# Patient Record
Sex: Male | Born: 1995 | Race: Black or African American | Hispanic: No | Marital: Single | Smoking: Never smoker
Health system: Southern US, Community
[De-identification: ages and names within clinical notes are randomized; demographics above are authoritative.]

## PROBLEM LIST (undated history)

## (undated) DIAGNOSIS — J45909 Unspecified asthma, uncomplicated: Secondary | ICD-10-CM

---
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
## 2017-04-03 ENCOUNTER — Encounter (HOSPITAL_COMMUNITY): Payer: Self-pay | Admitting: Emergency Medicine

## 2017-04-03 ENCOUNTER — Ambulatory Visit (HOSPITAL_COMMUNITY)
Admission: EM | Admit: 2017-04-03 | Discharge: 2017-04-03 | Disposition: A | Discharge status: Home / Self Care | Payer: BLUE CROSS/BLUE SHIELD | Attending: Emergency Medicine | Admitting: Emergency Medicine

## 2017-04-03 ENCOUNTER — Other Ambulatory Visit: Payer: Self-pay

## 2017-04-03 DIAGNOSIS — Z23 Encounter for immunization: Secondary | ICD-10-CM | POA: Diagnosis not present

## 2017-04-03 DIAGNOSIS — W268XXA Contact with other sharp object(s), not elsewhere classified, initial encounter: Secondary | ICD-10-CM | POA: Diagnosis not present

## 2017-04-03 DIAGNOSIS — S60411A Abrasion of left index finger, initial encounter: Secondary | ICD-10-CM

## 2017-04-03 MED ORDER — MUPIROCIN 2 % EX OINT
1.0000 | TOPICAL_OINTMENT | Freq: Two times a day (BID) | CUTANEOUS | 0 refills | Status: DC
Start: 2017-04-03 — End: 2017-07-11

## 2017-04-03 MED ORDER — TETANUS-DIPHTH-ACELL PERTUSSIS 5-2.5-18.5 LF-MCG/0.5 IM SUSP
0.5000 mL | Freq: Once | INTRAMUSCULAR | Status: AC
  Administered 2017-04-03: 0.5 mL via INTRAMUSCULAR

## 2017-04-03 MED ORDER — TETANUS-DIPHTH-ACELL PERTUSSIS 5-2.5-18.5 LF-MCG/0.5 IM SUSP
INTRAMUSCULAR | Status: AC
  Filled 2017-04-03: qty 0.5

## 2017-04-03 NOTE — ED Triage Notes (Signed)
Pt states he scraped his finger on a box at work. Pt c/o L pointer finger pain and swelling.

## 2017-04-03 NOTE — Discharge Instructions (Signed)
No signs of infection. Start bactroban as directed for the next 2-3 days. Daily dressing, you can use warm water with soap to gently clean the wound. Use finger splint to prevent bending so the wound will be able to heal. Monitor for worsening of symptoms, spreading redness, increased warmth, follow up for reevaluation.

## 2017-04-03 NOTE — ED Provider Notes (Signed)
[place]    CSN: [id number] Arrival date & time: 04/03/17  1616     History   Chief Complaint Chief Complaint  Patient presents with  . Hand Pain    HPI Brian [name] is a 21 y.o. male.   21 year old male comes in for left index finger pain and swelling after abrasion at work 3 days ago. States he was working at [employer] when he scraped his finger on a metal box. Bleeding controlled. He has been applying ointment from a first aid kit. Has not taken anything for the pain. He has increased pain and swelling and is worried of infection. Unknown last tetanus injection.        History reviewed. No pertinent past medical history.  There are no active problems to display for this patient.   Past Surgical History:  Procedure Laterality Date  . HERNIA REPAIR  2004       Home Medications    Prior to Admission medications   Medication Sig Start Date End Date Taking? Authorizing Provider  mupirocin ointment (BACTROBAN) 2 % Apply 1 application 2 (two) times daily topically. 04/03/17   Ok Edwards, PA-C    Family History No family history on file.  Social History Social History   Tobacco Use  . Smoking status: Never Smoker  Substance Use Topics  . Alcohol use: No  . Drug use: No     Allergies   Patient has no known allergies.   Review of Systems Review of Systems  Reason unable to perform ROS: See HPI as above.     Physical Exam Triage Vital Signs ED Triage Vitals [04/03/17 1630]  Enc Vitals Group     BP 124/69     Pulse Rate (!) 50     Resp 18     Temp 98.4 F (36.9 C)     Temp src      SpO2 100 %     Weight      Height      Head Circumference      Peak Flow      Pain Score 4     Pain Loc      Pain Edu?      Excl. in [place]?    No data found.  Updated Vital Signs BP 124/69   Pulse (!) 50   Temp 98.4 F (36.9 C)   Resp 18   SpO2 100%   Physical Exam  Constitutional: He is oriented to person, place, and time. He appears  well-developed and well-nourished. No distress.  HENT:  Head: Normocephalic and atraumatic.  Eyes: Conjunctivae are normal. Pupils are equal, round, and reactive to light.  Musculoskeletal:  0.5cm abrasion at left pointer finger at PIP joint. No surrounding erythema, increased warmth, discharge. No tenderness on palpation around the wound. Limited ROM of finger due to pulling of the wound. Cap refill <2s  Neurological: He is alert and oriented to person, place, and time.     UC Treatments / Results  Labs (all labs ordered are listed, but only abnormal results are displayed) Labs Reviewed - No data to display  EKG  EKG Interpretation None       Radiology No results found.  Procedures Procedures (including critical care time)  Medications Ordered in UC Medications  Tdap (BOOSTRIX) injection 0.5 mL (0.5 mLs Intramuscular Given 04/03/17 1741)     Initial Impression / Assessment and Plan / UC Course  I have reviewed the triage  vital signs and the nursing notes.  Pertinent labs & imaging results that were available during my care of the patient were reviewed by me and considered in my medical decision making (see chart for details).    No signs of infection this visit. Bactroban ointment as directed. Wound care instructions given. Finger splint applied to prevent bending and assist in wound healing. Return precautions given.   Final Clinical Impressions(s) / UC Diagnoses   Final diagnoses:  Abrasion of left index finger, initial encounter    ED Discharge Orders        Ordered    mupirocin ointment (BACTROBAN) 2 %  2 times daily     04/03/17 1733         Ok Edwards, PA-C 04/03/17 2129

## 2017-07-11 ENCOUNTER — Ambulatory Visit (HOSPITAL_COMMUNITY)
Admission: EM | Admit: 2017-07-11 | Discharge: 2017-07-11 | Disposition: A | Discharge status: Home / Self Care | Payer: BLUE CROSS/BLUE SHIELD | Attending: Internal Medicine | Admitting: Internal Medicine

## 2017-07-11 ENCOUNTER — Encounter (HOSPITAL_COMMUNITY): Payer: Self-pay | Admitting: Emergency Medicine

## 2017-07-11 ENCOUNTER — Other Ambulatory Visit: Payer: Self-pay

## 2017-07-11 DIAGNOSIS — M545 Low back pain, unspecified: Secondary | ICD-10-CM

## 2017-07-11 DIAGNOSIS — R1030 Lower abdominal pain, unspecified: Secondary | ICD-10-CM

## 2017-07-11 DIAGNOSIS — B349 Viral infection, unspecified: Secondary | ICD-10-CM | POA: Diagnosis not present

## 2017-07-11 HISTORY — DX: Unspecified asthma, uncomplicated: J45.909

## 2017-07-11 LAB — POCT URINALYSIS DIP (DEVICE)
Glucose, UA: NEGATIVE mg/dL
HGB URINE DIPSTICK: NEGATIVE
Ketones, ur: 40 mg/dL — AB
LEUKOCYTES UA: NEGATIVE
Nitrite: NEGATIVE
PH: 5.5 (ref 5.0–8.0)
Protein, ur: 30 mg/dL — AB
SPECIFIC GRAVITY, URINE: 1.025 (ref 1.005–1.030)
UROBILINOGEN UA: 1 mg/dL (ref 0.0–1.0)

## 2017-07-11 MED ORDER — KETOROLAC TROMETHAMINE 60 MG/2ML IM SOLN
60.0000 mg | Freq: Once | INTRAMUSCULAR | Status: AC
Start: 2017-07-11 — End: 2017-07-11
  Administered 2017-07-11: 60 mg via INTRAMUSCULAR

## 2017-07-11 MED ORDER — MELOXICAM 7.5 MG PO TABS: 7.5000 mg | ORAL_TABLET | Freq: Every day | ORAL | 0 refills | Status: DC

## 2017-07-11 MED ORDER — ACETAMINOPHEN 325 MG PO TABS
650.0000 mg | ORAL_TABLET | Freq: Once | ORAL | Status: AC
  Administered 2017-07-11: 650 mg via ORAL

## 2017-07-11 MED ORDER — BENZONATATE 100 MG PO CAPS: 100.0000 mg | ORAL_CAPSULE | Freq: Three times a day (TID) | ORAL | 0 refills | Status: AC

## 2017-07-11 MED ORDER — ACETAMINOPHEN 325 MG PO TABS
ORAL_TABLET | ORAL | Status: AC
  Filled 2017-07-11: qty 2

## 2017-07-11 MED ORDER — IPRATROPIUM BROMIDE 0.06 % NA SOLN: 2.0000 | Freq: Four times a day (QID) | NASAL | 0 refills | Status: AC

## 2017-07-11 MED ORDER — KETOROLAC TROMETHAMINE 60 MG/2ML IM SOLN
INTRAMUSCULAR | Status: AC
  Filled 2017-07-11: qty 2

## 2017-07-11 MED ORDER — FLUTICASONE PROPIONATE 50 MCG/ACT NA SUSP: 2.0000 | Freq: Every day | NASAL | 0 refills | Status: AC

## 2017-07-11 NOTE — ED Provider Notes (Signed)
[facility]    CSN: [id number] Arrival date & time: 07/11/17  1042     History   Chief Complaint Chief Complaint  Patient presents with  . URI    HPI Brian [name] is a 22 y.o. male.   22 year old male with history of asthma comes in for 3 day history of URI symptoms.  He has had productive cough, rhinorrhea, nasal congestion, sore throat.  Unknown fever.  Nausea without vomiting.  Denies diarrhea. He has had lower abdominal pain that he states "not too bad, its normal". States 2 days ago, started having sever back pain that is sharp/aching in nature. It is at the lumbar region and does not radiate. 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 Denies numbness, tingling, loss of bladder or bowel control.  He took ibuprofen 400 mg with some relief.  He denies urinary symptoms such as frequency, dysuria, hematuria.  He has not needed his inhaler for URI symptoms.  Never smoker.      Past Medical History:  Diagnosis Date  . Asthma     There are no active problems to display for this patient.   Past Surgical History:  Procedure Laterality Date  . HERNIA REPAIR  2004       Home Medications    Prior to Admission medications   Medication Sig Start Date End Date Taking? Authorizing Provider  diphenhydrAMINE (BENADRYL) 25 mg capsule Take 25 mg by mouth every 6 (six) hours as needed.   Yes [provider]  ibuprofen (ADVIL,MOTRIN) 200 MG tablet Take 200 mg by mouth every 6 (six) hours as needed.   Yes [provider]  benzonatate (TESSALON) 100 MG capsule Take 1 capsule (100 mg total) by mouth every 8 (eight) hours. 07/11/17   Cathie HoopsYu, Amy V, PA-C  fluticasone (FLONASE) 50 MCG/ACT nasal spray Place 2 sprays into both nostrils daily. 07/11/17   Cathie HoopsYu, Amy V, PA-C  ipratropium (ATROVENT) 0.06 % nasal spray Place 2 sprays into both nostrils 4 (four) times daily. 07/11/17   Cathie HoopsYu, Amy V, PA-C  meloxicam (MOBIC) 7.5 MG tablet Take 1  tablet (7.5 mg total) by mouth daily. 07/11/17   Belinda FisherYu, Amy V, PA-C    Family History Family History  Problem Relation Age of Onset  . Healthy Mother     Social History Social History   Tobacco Use  . Smoking status: Never Smoker  Substance Use Topics  . Alcohol use: Yes  . Drug use: No     Allergies   Patient has no known allergies.   Review of Systems Review of Systems  Reason unable to perform ROS: See HPI as above.     Physical Exam Triage Vital Signs ED Triage Vitals  Enc Vitals Group     BP 07/11/17 1153 121/78     Pulse Rate 07/11/17 1153 81     Resp 07/11/17 1153 18     Temp 07/11/17 1153 (!) 100.7 F (38.2 C)     Temp Source 07/11/17 1153 Oral     SpO2 07/11/17 1153 100 %     Weight --      Height --      Head Circumference --      Peak Flow --      Pain Score 07/11/17 1150 10     Pain Loc --      Pain Edu? --      Excl. in [place]? --    No  data found.  Updated Vital Signs BP 121/78 (BP Location: Right Arm)   Pulse 81   Temp (!) 100.7 F (38.2 C) (Oral)   Resp 18   SpO2 100%   Physical Exam  Constitutional: He is oriented to person, place, and time. He appears well-developed and well-nourished.  Patient unable to sit still, constantly moving on exam table, stating due to back pain. In mild distress due to pain.   HENT:  Head: Normocephalic and atraumatic.  Right Ear: External ear and ear canal normal. Tympanic membrane is erythematous. Tympanic membrane is not bulging.  Left Ear: External ear and ear canal normal. Tympanic membrane is erythematous. Tympanic membrane is not bulging.  Nose: Rhinorrhea present. Right sinus exhibits no maxillary sinus tenderness and no frontal sinus tenderness. Left sinus exhibits no maxillary sinus tenderness and no frontal sinus tenderness.  Mouth/Throat: Uvula is midline, oropharynx is clear and moist and mucous membranes are normal.  Eyes: Conjunctivae are normal. Pupils are equal, round, and reactive to light.    Neck: Normal range of motion. Neck supple.  Cardiovascular: Normal rate, regular rhythm and normal heart sounds. Exam reveals no gallop and no friction rub.  No murmur heard. Pulmonary/Chest: Effort normal and breath sounds normal. He has no decreased breath sounds. He has no wheezes. He has no rhonchi. He has no rales.  Abdominal: Soft. Bowel sounds are normal. He exhibits no distension. There is no tenderness. There is no rebound, no guarding and no CVA tenderness.  Musculoskeletal:  Diffuse tenderness of the lumbar region. Full ROM of back and hips. Strength normal and equal bilaterally. Sensation intact and equal bilaterally. Negative straight leg raise.   Lymphadenopathy:    He has no cervical adenopathy.  Neurological: He is alert and oriented to person, place, and time.  Skin: Skin is warm and dry.  Psychiatric: He has a normal mood and affect. His behavior is normal. Judgment normal.     UC Treatments / Results  Labs (all labs ordered are listed, but only abnormal results are displayed) Labs Reviewed  POCT URINALYSIS DIP (DEVICE) - Abnormal; Notable for the following components:      Result Value   Bilirubin Urine SMALL (*)    Ketones, ur 40 (*)    Protein, ur 30 (*)    All other components within normal limits    EKG  EKG Interpretation None       Radiology No results found.  Procedures Procedures (including critical care time)  Medications Ordered in UC Medications  acetaminophen (TYLENOL) tablet 650 mg (650 mg Oral Given 07/11/17 1204)  ketorolac (TORADOL) injection 60 mg (60 mg Intramuscular Given 07/11/17 1304)     Initial Impression / Assessment and Plan / UC Course  I have reviewed the triage vital signs and the nursing notes.  Pertinent labs & imaging results that were available during my care of the patient were reviewed by me and considered in my medical decision making (see chart for details).    Patient with great improvement of pain after  Toradol injection. Urine without blood/UTI. Discussed that urine dipstick results does not rule out kidney stone. Discussed possible flu causing symptoms. Given patient is outside of tamiflu time frame, will treat symptomatically. Mobic for pain given good control with toradol. Push fluids. Return precautions given. Patient expresses understanding and agrees to plan.  Case discussed with Dr Dayton Scrape, who agrees to plan.   Final Clinical Impressions(s) / UC Diagnoses   Final diagnoses:  Viral syndrome  Acute bilateral low back pain without sciatica    ED Discharge Orders        Ordered    fluticasone (FLONASE) 50 MCG/ACT nasal spray  Daily     07/11/17 1336    ipratropium (ATROVENT) 0.06 % nasal spray  4 times daily     07/11/17 1336    benzonatate (TESSALON) 100 MG capsule  Every 8 hours     07/11/17 1336    meloxicam (MOBIC) 7.5 MG tablet  Daily     07/11/17 1336        Belinda Fisher, PA-C 07/11/17 1507

## 2017-07-11 NOTE — ED Triage Notes (Signed)
Onset 3 days ago of feeling bad.  Pain in lower back, sore throat, headache, bumps on scalp.  Unsure if running a fever.

## 2017-07-11 NOTE — Discharge Instructions (Signed)
Tessalon for cough. Mobic for back pain, do not take ibuprofen (advil, motrin)/naproxen (aleve) while on mobic. Start flonase, atrovent nasal spray for nasal congestion/drainage. You can use over the counter nasal saline rinse such as neti pot for nasal congestion. Keep hydrated, your urine should be clear to pale yellow in color. Tylenol for fever and pain. Monitor for any worsening of symptoms, chest pain, shortness of breath, wheezing, swelling of the throat, follow up for reevaluation.  If experiencing numbness, tingling, loss of bladder or bowel control, go to the emergency department for further evaluation.  For sore throat try using a honey-based tea. Use 3 teaspoons of honey with juice squeezed from half lemon. Place shaved pieces of ginger into 1/2-1 cup of water and warm over stove top. Then mix the ingredients and repeat every 4 hours as needed.

## 2017-08-04 ENCOUNTER — Ambulatory Visit: Payer: BLUE CROSS/BLUE SHIELD | Admitting: Internal Medicine

## 2017-08-04 ENCOUNTER — Ambulatory Visit: Payer: BLUE CROSS/BLUE SHIELD | Admitting: Family Medicine

## 2018-05-07 ENCOUNTER — Ambulatory Visit (INDEPENDENT_AMBULATORY_CARE_PROVIDER_SITE_OTHER): Payer: BLUE CROSS/BLUE SHIELD | Admitting: Physician Assistant

## 2018-05-11 ENCOUNTER — Ambulatory Visit (INDEPENDENT_AMBULATORY_CARE_PROVIDER_SITE_OTHER): Payer: BLUE CROSS/BLUE SHIELD | Admitting: Physician Assistant

## 2019-01-20 ENCOUNTER — Emergency Department (HOSPITAL_COMMUNITY)
Admission: EM | Admit: 2019-01-20 | Discharge: 2019-01-20 | Disposition: A | Discharge status: Home / Self Care | Payer: BLUE CROSS/BLUE SHIELD | Attending: Emergency Medicine | Admitting: Emergency Medicine

## 2019-01-20 ENCOUNTER — Other Ambulatory Visit: Payer: Self-pay

## 2019-01-20 DIAGNOSIS — J45909 Unspecified asthma, uncomplicated: Secondary | ICD-10-CM | POA: Insufficient documentation

## 2019-01-20 DIAGNOSIS — R112 Nausea with vomiting, unspecified: Secondary | ICD-10-CM | POA: Insufficient documentation

## 2019-01-20 LAB — COMPREHENSIVE METABOLIC PANEL
ALT: 13 U/L (ref 0–44)
AST: 19 U/L (ref 15–41)
Albumin: 4.6 g/dL (ref 3.5–5.0)
Alkaline Phosphatase: 57 U/L (ref 38–126)
Anion gap: 10 (ref 5–15)
BUN: 10 mg/dL (ref 6–20)
CO2: 29 mmol/L (ref 22–32)
Calcium: 9.8 mg/dL (ref 8.9–10.3)
Chloride: 102 mmol/L (ref 98–111)
Creatinine, Ser: 1.32 mg/dL — ABNORMAL HIGH (ref 0.61–1.24)
GFR calc Af Amer: >60 mL/min (ref >60)
GFR calc non Af Amer: >60 mL/min (ref >60)
Glucose, Bld: 89 mg/dL (ref 70–99)
Potassium: 3.8 mmol/L (ref 3.5–5.1)
Sodium: 141 mmol/L (ref 135–145)
Total Bilirubin: 1.1 mg/dL (ref 0.3–1.2)
Total Protein: 7 g/dL (ref 6.5–8.1)

## 2019-01-20 LAB — URINALYSIS, ROUTINE W REFLEX MICROSCOPIC
Bilirubin Urine: NEGATIVE
Glucose, UA: NEGATIVE mg/dL
Hgb urine dipstick: NEGATIVE
Ketones, ur: NEGATIVE mg/dL
Leukocytes,Ua: NEGATIVE
Nitrite: NEGATIVE
Protein, ur: NEGATIVE mg/dL
Specific Gravity, Urine: 1.024 (ref 1.005–1.030)
pH: 5 (ref 5.0–8.0)

## 2019-01-20 LAB — CBC
HCT: 44.2 % (ref 39.0–52.0)
Hemoglobin: 15.5 g/dL (ref 13.0–17.0)
MCH: 32 pg (ref 26.0–34.0)
MCHC: 35.1 g/dL (ref 30.0–36.0)
MCV: 91.1 fL (ref 80.0–100.0)
Platelets: 182 10*3/uL (ref 150–400)
RBC: 4.85 MIL/uL (ref 4.22–5.81)
RDW: 11.9 % (ref 11.5–15.5)
WBC: 5.5 10*3/uL (ref 4.0–10.5)
nRBC: 0 % (ref 0.0–0.2)

## 2019-01-20 LAB — LIPASE, BLOOD: Lipase: 20 U/L (ref 11–51)

## 2019-01-20 MED ORDER — FAMOTIDINE 20 MG PO TABS: 20.0000 mg | ORAL_TABLET | Freq: Two times a day (BID) | ORAL | 0 refills | Status: AC

## 2019-01-20 MED ORDER — FAMOTIDINE 20 MG PO TABS: 20.0000 mg | ORAL_TABLET | Freq: Two times a day (BID) | ORAL | 0 refills | Status: DC

## 2019-01-20 MED ORDER — SODIUM CHLORIDE 0.9% FLUSH: 3.0000 mL | Freq: Once | INTRAVENOUS | Status: DC

## 2019-01-20 MED ORDER — PROMETHAZINE HCL 25 MG PO TABS: 25.0000 mg | ORAL_TABLET | Freq: Four times a day (QID) | ORAL | 0 refills | Status: AC | PRN

## 2019-01-20 NOTE — ED Notes (Signed)
Patient verbalizes understanding of discharge instructions. Opportunity for questioning and answers were provided. Armband removed by staff, pt discharged from ED.  

## 2019-01-20 NOTE — ED Provider Notes (Signed)
[facility] EMERGENCY DEPARTMENT Provider Note   CSN: [id number] Arrival date & time: 01/20/19  0403     History   Chief Complaint Chief Complaint  Patient presents with  . Emesis  . Nausea    HPI Brian [name] is a 23 y.o. male.     Patient presents to the emergency department with concerns of possible internal bleeding.  He reports that he had an episode of vomiting tonight and what he threw up was brown and look like coffee grounds.  He does not have any history of GI bleeding.  He has no history of acid reflux or stomach problems.  He has bowel movements have been normal, no melena.  He did not notice any blood in the vomit.  Here in the ER he just feels bloated, has not had any significant abdominal pain.  He got on [employer] after he vomited and became concerned so he came to the ER.     Past Medical History:  Diagnosis Date  . Asthma     There are no active problems to display for this patient.   Past Surgical History:  Procedure Laterality Date  . HERNIA REPAIR  2004        Home Medications    Prior to Admission medications   Medication Sig Start Date End Date Taking? Authorizing Provider  benzonatate (TESSALON) 100 MG capsule Take 1 capsule (100 mg total) by mouth every 8 (eight) hours. 07/11/17   Cathie Hoops, Amy V, PA-C  diphenhydrAMINE (BENADRYL) 25 mg capsule Take 25 mg by mouth every 6 (six) hours as needed.    [provider]  famotidine (PEPCID) 20 MG tablet Take 1 tablet (20 mg total) by mouth 2 (two) times daily. 01/20/19   Gilda Crease, MD  fluticasone (FLONASE) 50 MCG/ACT nasal spray Place 2 sprays into both nostrils daily. 07/11/17   Cathie Hoops, Amy V, PA-C  ibuprofen (ADVIL,MOTRIN) 200 MG tablet Take 200 mg by mouth every 6 (six) hours as needed.    [provider]  ipratropium (ATROVENT) 0.06 % nasal spray Place 2 sprays into both nostrils 4 (four) times daily. 07/11/17   Cathie Hoops, Amy V, PA-C  meloxicam (MOBIC) 7.5 MG  tablet Take 1 tablet (7.5 mg total) by mouth daily. 07/11/17   Belinda Fisher, PA-C  promethazine (PHENERGAN) 25 MG tablet Take 1 tablet (25 mg total) by mouth every 6 (six) hours as needed for nausea or vomiting. 01/20/19   , Canary Brim, MD    Family History Family History  Problem Relation Age of Onset  . Healthy Mother     Social History Social History   Tobacco Use  . Smoking status: Never Smoker  Substance Use Topics  . Alcohol use: Yes  . Drug use: No     Allergies   Patient has no known allergies.   Review of Systems Review of Systems  Gastrointestinal: Positive for nausea and vomiting.  All other systems reviewed and are negative.    Physical Exam Updated Vital Signs BP 110/73 (BP Location: Right Arm)   Pulse (!) 38   Temp 98.1 F (36.7 C) (Oral)   Resp 16   SpO2 100%   Physical Exam Vitals signs and nursing note reviewed.  Constitutional:      General: He is not in acute distress.    Appearance: Normal appearance. He is well-developed.  HENT:     Head: Normocephalic and atraumatic.     Right Ear: Hearing normal.  Left Ear: Hearing normal.     Nose: Nose normal.  Eyes:     Conjunctiva/sclera: Conjunctivae normal.     Pupils: Pupils are equal, round, and reactive to light.  Neck:     Musculoskeletal: Normal range of motion and neck supple.  Cardiovascular:     Rate and Rhythm: Regular rhythm.     Heart sounds: S1 normal and S2 normal. No murmur. No friction rub. No gallop.   Pulmonary:     Effort: Pulmonary effort is normal. No respiratory distress.     Breath sounds: Normal breath sounds.  Chest:     Chest wall: No tenderness.  Abdominal:     General: Bowel sounds are normal.     Palpations: Abdomen is soft.     Tenderness: There is no abdominal tenderness. There is no guarding or rebound. Negative signs include Murphy's sign and McBurney's sign.     Hernia: No hernia is present.  Musculoskeletal: Normal range of motion.  Skin:     General: Skin is warm and dry.     Findings: No rash.  Neurological:     Mental Status: He is alert and oriented to person, place, and time.     GCS: GCS eye subscore is 4. GCS verbal subscore is 5. GCS motor subscore is 6.     Cranial Nerves: No cranial nerve deficit.     Sensory: No sensory deficit.     Coordination: Coordination normal.  Psychiatric:        Speech: Speech normal.        Behavior: Behavior normal.        Thought Content: Thought content normal.      ED Treatments / Results  Labs (all labs ordered are listed, but only abnormal results are displayed) Labs Reviewed  COMPREHENSIVE METABOLIC PANEL - Abnormal; Notable for the following components:      Result Value   Creatinine, Ser 1.32 (*)    All other components within normal limits  LIPASE, BLOOD  CBC  URINALYSIS, ROUTINE W REFLEX MICROSCOPIC    EKG None  Radiology No results found.  Procedures Procedures (including critical care time)  Medications Ordered in ED Medications  sodium chloride flush (NS) 0.9 % injection 3 mL (has no administration in time range)     Initial Impression / Assessment and Plan / ED Course  I have reviewed the triage vital signs and the nursing notes.  Pertinent labs & imaging results that were available during my care of the patient were reviewed by me and considered in my medical decision making (see chart for details).        Patient presents to the emergency department with concerns of possible "internal bleeding".  He had an episode of emesis tonight.  He reports that he threw up a brown substance that looked like "coffee grounds".  When he checked with school, he found that this might mean that he has internal bleeding and he should come to the ER.  Patient's exam is unremarkable.  He has not been experiencing any significant abdominal pain.  Abdominal exam is benign and nontender.  He has no history of peptic ulcer disease or reflux disease.  He has not had any  melanotic stools.  Doubt that this was coffee-ground emesis, likely just brown emesis.  Will empirically start Pepcid, counseled patient to return to the ER for any hematemesis, melena or recurrent coffee-ground emesis.  Final Clinical Impressions(s) / ED Diagns   Final diagnoses:  Non-intractable vomiting  with nausea, unspecified vomiting type    ED Discharge Orders         Ordered    promethazine (PHENERGAN) 25 MG tablet  Every 6 hours PRN     01/20/19 0646    famotidine (PEPCID) 20 MG tablet  2 times daily     01/20/19 0646           Gilda CreasePollina,  J, MD 01/20/19 [phone]

## 2019-01-20 NOTE — Discharge Instructions (Signed)
If you vomit any blood, pass any blood in your stools, have black and tarry like stools or any recurrent episodes of vomiting substance that looks like coffee grounds, return to the ER.

## 2019-01-20 NOTE — ED Triage Notes (Signed)
Patient with coffee ground emesis.  He states that it started thirty minutes prior to arrival.  He states that it has never happened before.  He denies any rectal bleeding at this time.  Patient denies any abdominal pain.

## 2019-03-20 ENCOUNTER — Ambulatory Visit (HOSPITAL_COMMUNITY)
Admission: EM | Admit: 2019-03-20 | Discharge: 2019-03-20 | Disposition: A | Discharge status: Home / Self Care | Payer: Self-pay | Attending: Family Medicine | Admitting: Family Medicine

## 2019-03-20 ENCOUNTER — Other Ambulatory Visit: Payer: Self-pay

## 2019-03-20 ENCOUNTER — Encounter (HOSPITAL_COMMUNITY): Payer: Self-pay

## 2019-03-20 DIAGNOSIS — Z23 Encounter for immunization: Secondary | ICD-10-CM

## 2019-03-20 DIAGNOSIS — S41101A Unspecified open wound of right upper arm, initial encounter: Secondary | ICD-10-CM

## 2019-03-20 DIAGNOSIS — W25XXXA Contact with sharp glass, initial encounter: Secondary | ICD-10-CM

## 2019-03-20 MED ORDER — TETANUS-DIPHTH-ACELL PERTUSSIS 5-2.5-18.5 LF-MCG/0.5 IM SUSP
0.5000 mL | Freq: Once | INTRAMUSCULAR | Status: AC
  Administered 2019-03-20: 18:00:00 0.5 mL via INTRAMUSCULAR

## 2019-03-20 MED ORDER — TETANUS-DIPHTH-ACELL PERTUSSIS 5-2.5-18.5 LF-MCG/0.5 IM SUSP
INTRAMUSCULAR | Status: AC
  Filled 2019-03-20: qty 0.5

## 2019-03-20 NOTE — Discharge Instructions (Signed)
Meds ordered this encounter  Medications   Tdap (BOOSTRIX) injection 0.5 mL    

## 2019-03-20 NOTE — ED Triage Notes (Signed)
Pt states he was putting up a window frame and the glass broke and shatter over his right arm. This happened last night, ( right forearm )

## 2019-03-21 NOTE — ED Provider Notes (Signed)
  [place]   [id number] 03/20/19 Arrival Time: 6789  ASSESSMENT & PLAN:  1. Wounds, multiple open, arm, right, initial encounter     Meds ordered this encounter  Medications  . Tdap (BOOSTRIX) injection 0.5 mL   No wounds needing repair. Small piece of glass removed form R forearm. No other foreign bodies appreciated. No significant bleeding. No complications. Tolerated procedure well. To keep areas clean and dry. Watch for s/s of infection. He voiced understanding.  Reviewed expectations re: course of current medical issues. Questions answered. Outlined signs and symptoms indicating need for more acute intervention. Patient verbalized understanding. After Visit Summary given.   SUBJECTIVE:  Brian [name] is a 23 y.o. male who presents with several small cuts/wounds to his R arm around elbow and forearm. Today. Picture frame glass shattered cutting his arm. Minimal bleeding; easily controlled. No associated pain. Describes FROM of RUE. No extremity sensation changes or weakness. No specific aggravating or alleviating factors reported. Washed wounds at home.  Td UTD: No.  ROS: As per HPI.   OBJECTIVE:  Vitals:   03/20/19 1647  BP: 108/69  Pulse: 86  Resp: 18  Temp: 98.1 F (36.7 C)  TempSrc: Oral  SpO2: 100%  Weight: 68 kg     General appearance: alert; no distress Skin: multiple small approx 0.77mm and less wounds to R arm around elbow and proximal forearm; one with a very small piece of glass palpable; without active bleeding; RUE with normal ROM, distal sensation, and capillary refill Psychological: alert and cooperative; normal mood and affect   No Known Allergies  Past Medical History:  Diagnosis Date  . Asthma    Social History   Socioeconomic History  . Marital status: Single    Spouse name: Not on file  . Number of children: Not on file  . Years of education: Not on file  . Highest education level: Not on file  Occupational  History  . Not on file  Social Needs  . Financial resource strain: Not on file  . Food insecurity    Worry: Not on file    Inability: Not on file  . Transportation needs    Medical: Not on file    Non-medical: Not on file  Tobacco Use  . Smoking status: Never Smoker  . Smokeless tobacco: Never Used  Substance and Sexual Activity  . Alcohol use: Yes  . Drug use: No  . Sexual activity: Not on file  Lifestyle  . Physical activity    Days per week: Not on file    Minutes per session: Not on file  . Stress: Not on file  Relationships  . Social Herbalist on phone: Not on file    Gets together: Not on file    Attends religious service: Not on file    Active member of club or organization: Not on file    Attends meetings of clubs or organizations: Not on file    Relationship status: Not on file  Other Topics Concern  . Not on file  Social History Narrative  . Not on file         Vanessa Kick, MD 03/21/19 [phone]

## 2022-12-12 ENCOUNTER — Other Ambulatory Visit: Payer: Self-pay

## 2022-12-12 ENCOUNTER — Emergency Department (HOSPITAL_COMMUNITY)
Admission: EM | Admit: 2022-12-12 | Discharge: 2022-12-13 | Disposition: A | Discharge status: Home / Self Care | Payer: Medicaid Other | Attending: Emergency Medicine | Admitting: Emergency Medicine

## 2022-12-12 ENCOUNTER — Encounter (HOSPITAL_COMMUNITY): Payer: Self-pay

## 2022-12-12 DIAGNOSIS — K0889 Other specified disorders of teeth and supporting structures: Secondary | ICD-10-CM | POA: Diagnosis not present

## 2022-12-12 DIAGNOSIS — J45909 Unspecified asthma, uncomplicated: Secondary | ICD-10-CM | POA: Diagnosis not present

## 2022-12-12 NOTE — ED Triage Notes (Addendum)
Pt arrived from home via POV c/o right, upper dental pain now causing a head ache, started approx a week ago. Pt was trying to find dentist to accept his insurance but has been unsuccessful and states that the pain has become intolerable 10/10 on pain scale.

## 2022-12-13 LAB — CBC WITH DIFFERENTIAL/PLATELET
Abs Immature Granulocytes: 0.01 K/uL (ref 0.00–0.07)
Basophils Absolute: 0 K/uL (ref 0.0–0.1)
Basophils Relative: 1 %
Eosinophils Absolute: 0.1 K/uL (ref 0.0–0.5)
Eosinophils Relative: 3 %
HCT: 41.7 % (ref 39.0–52.0)
Hemoglobin: 14.7 g/dL (ref 13.0–17.0)
Immature Granulocytes: 0 %
Lymphocytes Relative: 45 %
Lymphs Abs: 2.1 K/uL (ref 0.7–4.0)
MCH: 33.5 pg (ref 26.0–34.0)
MCHC: 35.3 g/dL (ref 30.0–36.0)
MCV: 95 fL (ref 80.0–100.0)
Monocytes Absolute: 0.3 K/uL (ref 0.1–1.0)
Monocytes Relative: 6 %
Neutro Abs: 2 K/uL (ref 1.7–7.7)
Neutrophils Relative %: 45 %
Platelets: 202 K/uL (ref 150–400)
RBC: 4.39 MIL/uL (ref 4.22–5.81)
RDW: 12.3 % (ref 11.5–15.5)
WBC: 4.5 K/uL (ref 4.0–10.5)
nRBC: 0 % (ref 0.0–0.2)

## 2022-12-13 LAB — COMPREHENSIVE METABOLIC PANEL WITH GFR
ALT: 14 U/L (ref 0–44)
AST: 25 U/L (ref 15–41)
Albumin: 4.2 g/dL (ref 3.5–5.0)
Alkaline Phosphatase: 55 U/L (ref 38–126)
Anion gap: 7 (ref 5–15)
BUN: 11 mg/dL (ref 6–20)
CO2: 27 mmol/L (ref 22–32)
Calcium: 9.2 mg/dL (ref 8.9–10.3)
Chloride: 104 mmol/L (ref 98–111)
Creatinine, Ser: 1.13 mg/dL (ref 0.61–1.24)
GFR, Estimated: >60 mL/min
Glucose, Bld: 131 mg/dL — ABNORMAL HIGH (ref 70–99)
Potassium: 4.5 mmol/L (ref 3.5–5.1)
Sodium: 138 mmol/L (ref 135–145)
Total Bilirubin: 0.5 mg/dL (ref 0.3–1.2)
Total Protein: 6.4 g/dL — ABNORMAL LOW (ref 6.5–8.1)

## 2022-12-13 MED ORDER — PENICILLIN V POTASSIUM 500 MG PO TABS: 500.0000 mg | ORAL_TABLET | Freq: Four times a day (QID) | ORAL | 0 refills | Status: AC

## 2022-12-13 MED ORDER — OXYCODONE-ACETAMINOPHEN 5-325 MG PO TABS
2.0000 | ORAL_TABLET | Freq: Once | ORAL | Status: AC
  Administered 2022-12-13: 2 via ORAL
  Filled 2022-12-13: qty 2

## 2022-12-13 MED ORDER — NAPROXEN 500 MG PO TABS: 500.0000 mg | ORAL_TABLET | Freq: Two times a day (BID) | ORAL | 0 refills | Status: AC

## 2022-12-13 MED ORDER — KETOROLAC TROMETHAMINE 60 MG/2ML IM SOLN
30.0000 mg | Freq: Once | INTRAMUSCULAR | Status: AC
  Administered 2022-12-13: 30 mg via INTRAMUSCULAR
  Filled 2022-12-13: qty 2

## 2022-12-13 NOTE — Discharge Instructions (Addendum)
You were evaluated today for dental pain.  I have prescribed antibiotics for infection coverage.  I also prescribed Naprosyn for inflammation.  It is important that you schedule a follow-up appointment with dentistry for definitive management.

## 2022-12-13 NOTE — ED Provider Notes (Signed)
EMERGENCY DEPARTMENT AT [facility] Provider Note   CSN: [id number] Arrival date & time: 12/12/22  2318     History  Chief Complaint  Patient presents with   Dental Pain    Brian [name] is a 27 y.o. male. Patient presents to the emergency department complaining of upper left sided dental pain. Pain has been ongoing for approximately 1 week.  Patient states he has been trying to find a dentist but due to his Medicaid insurance has been having difficulty.  He rates pain initially as 10 out of 10 in severity.  He denies fevers, nausea, vomiting.  Past medical history significant for asthma  HPI     Home Medications Prior to Admission medications   Medication Sig Start Date End Date Taking? Authorizing Provider  naproxen (NAPROSYN) 500 MG tablet Take 1 tablet (500 mg total) by mouth 2 (two) times daily. 12/13/22  Yes Barrie Dunker B, PA-C  penicillin v potassium (VEETID) 500 MG tablet Take 1 tablet (500 mg total) by mouth 4 (four) times daily for 10 days. 12/13/22 12/23/22 Yes Darrick Grinder, PA-C  benzonatate (TESSALON) 100 MG capsule Take 1 capsule (100 mg total) by mouth every 8 (eight) hours. 07/11/17   Cathie Hoops, Amy V, PA-C  diphenhydrAMINE (BENADRYL) 25 mg capsule Take 25 mg by mouth every 6 (six) hours as needed.    [provider]  famotidine (PEPCID) 20 MG tablet Take 1 tablet (20 mg total) by mouth 2 (two) times daily. 01/20/19   Gilda Crease, MD  fluticasone (FLONASE) 50 MCG/ACT nasal spray Place 2 sprays into both nostrils daily. 07/11/17   Cathie Hoops, Amy V, PA-C  ipratropium (ATROVENT) 0.06 % nasal spray Place 2 sprays into both nostrils 4 (four) times daily. 07/11/17   Belinda Fisher, PA-C  promethazine (PHENERGAN) 25 MG tablet Take 1 tablet (25 mg total) by mouth every 6 (six) hours as needed for nausea or vomiting. 01/20/19   Pollina, Canary Brim, MD      Allergies    Patient has no known allergies.    Review of Systems   Review of  Systems  Physical Exam Updated Vital Signs BP 114/75   Pulse (!) 38   Temp 98.3 F (36.8 C) (Oral)   Resp 18   Ht 6\' 1"  (1.854 m)   Wt 70.4 kg   SpO2 99%   BMI 20.48 kg/m  Physical Exam HENT:     Head: Normocephalic and atraumatic.     Mouth/Throat:   Pulmonary:     Effort: Pulmonary effort is normal. No respiratory distress.  Musculoskeletal:        General: No signs of injury.     Cervical back: Normal range of motion.  Skin:    General: Skin is dry.  Neurological:     Mental Status: He is alert.  Psychiatric:        Speech: Speech normal.        Behavior: Behavior normal.     ED Results / Procedures / Treatments   Labs (all labs ordered are listed, but only abnormal results are displayed) Labs Reviewed  COMPREHENSIVE METABOLIC PANEL - Abnormal; Notable for the following components:      Result Value   Glucose, Bld 131 (*)    Total Protein 6.4 (*)    All other components within normal limits  CBC WITH DIFFERENTIAL/PLATELET    EKG EKG Interpretation Date/Time:  Tuesday December 13 2022 03:18:51 EDT Ventricular Rate:  37  PR Interval:  148 QRS Duration:  94 QT Interval:  458 QTC Calculation: 359 R Axis:   59  Text Interpretation: Marked sinus bradycardia Confirmed by Nicanor Alcon, April ([id number]) on 12/13/2022 3:20:54 AM  Radiology No results found.  Procedures Procedures    Medications Ordered in ED Medications  oxyCODONE-acetaminophen (PERCOCET/ROXICET) 5-325 MG per tablet 2 tablet (2 tablets Oral Given 12/13/22 0248)  ketorolac (TORADOL) injection 30 mg (30 mg Intramuscular Given 12/13/22 0248)    ED Course/ Medical Decision Making/ A&P                             Medical Decision Making Amount and/or Complexity of Data Reviewed Labs: ordered.  Risk Prescription drug management.   Patient with a chief complaint of dental pain.  Differential diagnosis includes but not limited to fracture, dental cavity, dental abscess, others  There is no  indication at this time for labs or imaging.  The patient was noted to be bradycardic and an EKG was ordered.  Patient was in sinus bradycardia with a rate in the 30s.  The patient denies any weakness, shortness of breath, lightheadedness, chest pain.  The patient was administered oxycodone and Toradol for pain.  Upon reassessment the patient was feeling better.  Of note, these medications were apparently ordered during the triage process.  Patient has tenderness to palpation of the left upper molar with some mild erythema in the surrounding gumline.  No drainable abscess noted.  Plan to treat with antibiotics and anti-inflammatories.  Patient voices understanding that he needs dental intervention for definitive management.  Contact information for on-call dental provider provided.  Plan to discharge home at this time.       Final Clinical Impression(s) / ED Diagnoses Final diagnoses:  Pain, dental    Rx / DC Orders ED Discharge Orders          Ordered    penicillin v potassium (VEETID) 500 MG tablet  4 times daily        12/13/22 0250    naproxen (NAPROSYN) 500 MG tablet  2 times daily        12/13/22 0250              Pamala Duffel 12/13/22 0343    Palumbo, April, MD 12/13/22 [phone]
